# Patient Record
Sex: Female | Born: 1990 | Race: Black or African American | Hispanic: No | Marital: Single | Smoking: Never smoker
Health system: Southern US, Community
[De-identification: ages and names within clinical notes are randomized; demographics above are authoritative.]

---

## 2013-05-10 DIAGNOSIS — IMO0002 Reserved for concepts with insufficient information to code with codable children: Secondary | ICD-10-CM | POA: Insufficient documentation

## 2014-12-15 ENCOUNTER — Encounter (HOSPITAL_BASED_OUTPATIENT_CLINIC_OR_DEPARTMENT_OTHER): Payer: Self-pay | Admitting: Emergency Medicine

## 2014-12-15 ENCOUNTER — Emergency Department (HOSPITAL_BASED_OUTPATIENT_CLINIC_OR_DEPARTMENT_OTHER)

## 2014-12-15 ENCOUNTER — Emergency Department (HOSPITAL_BASED_OUTPATIENT_CLINIC_OR_DEPARTMENT_OTHER)
Admission: EM | Admit: 2014-12-15 | Discharge: 2014-12-15 | Disposition: A | Discharge status: Home / Self Care | Attending: Physician Assistant | Admitting: Physician Assistant

## 2014-12-15 DIAGNOSIS — S99912A Unspecified injury of left ankle, initial encounter: Secondary | ICD-10-CM | POA: Diagnosis not present

## 2014-12-15 DIAGNOSIS — Y990 Civilian activity done for income or pay: Secondary | ICD-10-CM | POA: Diagnosis not present

## 2014-12-15 DIAGNOSIS — W101XXA Fall (on)(from) sidewalk curb, initial encounter: Secondary | ICD-10-CM | POA: Insufficient documentation

## 2014-12-15 DIAGNOSIS — Y9389 Activity, other specified: Secondary | ICD-10-CM | POA: Insufficient documentation

## 2014-12-15 DIAGNOSIS — Y9289 Other specified places as the place of occurrence of the external cause: Secondary | ICD-10-CM | POA: Insufficient documentation

## 2014-12-15 MED ORDER — HYDROCODONE-ACETAMINOPHEN 5-325 MG PO TABS
2.0000 | ORAL_TABLET | Freq: Once | ORAL | Status: AC
  Administered 2014-12-15: 2 via ORAL
  Filled 2014-12-15: qty 2

## 2014-12-15 MED ORDER — NAPROXEN 500 MG PO TABS: 500.0000 mg | ORAL_TABLET | Freq: Two times a day (BID) | ORAL | Status: DC

## 2014-12-15 NOTE — ED Notes (Signed)
Patient states that she stepped down off a curb at work and hurt her left ankle

## 2014-12-15 NOTE — ED Provider Notes (Signed)
CSN: [id number]     Arrival date & time 12/15/14  2039 History   First MD Initiated Contact with Patient 12/15/14 2107     Chief Complaint  Patient presents with  . Ankle Pain    left     (Consider location/radiation/quality/duration/timing/severity/associated sxs/prior Treatment) Patient is a 24 y.o. female presenting with ankle pain. The history is provided by the patient and medical records.  Ankle Pain  24 year old female with no significant past medical history presenting to the ED for left ankle injury. Patient states she stepped off a curb it was covered and leaves and twisted her left ankle. She states she fell to the ground. No head injury or loss of consciousness.  Patient reports diffuse pain of left ankle. She's been unable to bear weight due to pain. She denies any numbness or weakness of her left leg or foot. No prior left ankle injuries or surgeries. No intervention tried prior to arrival.  History reviewed. No pertinent past medical history. History reviewed. No pertinent past surgical history. History reviewed. No pertinent family history. Social History  Substance Use Topics  . Smoking status: Never Smoker   . Smokeless tobacco: None  . Alcohol Use: No   OB History    No data available     Review of Systems  Musculoskeletal: Positive for arthralgias.  All other systems reviewed and are negative.     Allergies  Review of patient's allergies indicates no known allergies.  Home Medications   Prior to Admission medications   Not on File   BP 95/58 mmHg  Pulse 76  Temp(Src) 98.3 F (36.8 C) (Oral)  Resp 16  Ht  (1.549 m)  Wt 195 lb (88.451 kg)  BMI 36.86 kg/m2  SpO2 100%  LMP 12/12/2014   Physical Exam  Constitutional: She is oriented to person, place, and time. She appears well-developed and well-nourished. No distress.  HENT:  Head: Normocephalic and atraumatic.  Mouth/Throat: Oropharynx is clear and moist.  Eyes: Conjunctivae and EOM  are normal. Pupils are equal, round, and reactive to light.  Neck: Normal range of motion. Neck supple.  Cardiovascular: Normal rate, regular rhythm and normal heart sounds.   Pulmonary/Chest: Effort normal and breath sounds normal. No respiratory distress. She has no wheezes.  Abdominal: Soft. Bowel sounds are normal. There is no tenderness. There is no guarding.  Musculoskeletal: Normal range of motion. She exhibits no edema.       Left ankle: She exhibits swelling. She exhibits normal range of motion, no ecchymosis, no deformity, no laceration and normal pulse. Tenderness. Lateral malleolus tenderness found. Achilles tendon normal.  Left ankle with tenderness and swelling along lateral malleolus, no acute bony deformity noted; limited range of motion due to pain and poor patient effort; DP pulse intact; moving all toes appropriately; normal sensation throughout foot  Neurological: She is alert and oriented to person, place, and time.  Skin: Skin is warm and dry. She is not diaphoretic.  Psychiatric: She has a normal mood and affect.  Nursing note and vitals reviewed.   ED Course  Procedures (including critical care time) Labs Review Labs Reviewed - No data to display  Imaging Review No results found. I have personally reviewed and evaluated these images and lab results as part of my medical decision-making.   EKG Interpretation None      MDM   Final diagnoses:  Left ankle injury, initial encounter   24 year old female here with left ankle injury after twisting ankle on  a curb. No head injury or loss of consciousness. No acute deformity noted on exam. Swelling and tenderness of left lateral malleolus noted. X-ray was obtained which is negative for acute bony findings. Suspect sprain type injury. Patient was placed in ASO brace and given crutches. Will discharge home with supportive care, encouraged to ice and elevate ankle at home to help with pain and/or swelling. Patient is to  follow-up with her PCP. Orthopedic follow-up also given if needed.  Discussed plan with patient, he/she acknowledged understanding and agreed with plan of care.  Return precautions given for new or worsening symptoms.  Garlon HatchetLisa M Danesha Kirchoff, PA-C 12/15/14 2211  Courteney Randall AnLyn Mackuen, MD 12/16/14 [phone]

## 2014-12-15 NOTE — Discharge Instructions (Signed)
Take the prescribed medication as directed.  Recommend to ice and elevate ankle at home to help with pain and/or swelling. Follow-up with your primary care physician. You may also follow-up with orthopedics if needed-- referral provided. Return to the ED for new or worsening symptoms.

## 2014-12-18 ENCOUNTER — Encounter: Payer: Self-pay | Admitting: Family Medicine

## 2014-12-18 ENCOUNTER — Ambulatory Visit: Payer: Worker's Compensation | Admitting: Family Medicine

## 2014-12-18 ENCOUNTER — Ambulatory Visit (INDEPENDENT_AMBULATORY_CARE_PROVIDER_SITE_OTHER): Payer: Worker's Compensation | Admitting: Family Medicine

## 2014-12-18 VITALS — BP 100/63 | HR 72 | Ht 61.0 in | Wt 190.0 lb

## 2014-12-18 DIAGNOSIS — S93402A Sprain of unspecified ligament of left ankle, initial encounter: Secondary | ICD-10-CM | POA: Diagnosis not present

## 2014-12-18 MED ORDER — HYDROCODONE-ACETAMINOPHEN 5-325 MG PO TABS: 1.0000 | ORAL_TABLET | Freq: Four times a day (QID) | ORAL | Status: DC | PRN

## 2014-12-18 NOTE — Patient Instructions (Signed)
You have an ankle sprain. Ice the area for 15 minutes at a time, 3-4 times a day Aleve 2 tabs twice a day with food OR ibuprofen 3 tabs three times a day with food for pain and inflammation. Elevate above the level of your heart when possible Crutches if needed to help with walking Bear weight when tolerated Use boot or laceup ankle brace to help with stability while you recover from this injury. Come out of the boot/brace twice a day to do Up/down and alphabet exercises 2-3 sets of each. Consider physical therapy for strengthening and balance exercises in the future. If not improving as expected, we may repeat x-rays or consider further testing like an MRI. Follow up with me in 2 weeks.

## 2014-12-19 ENCOUNTER — Telehealth: Payer: Self-pay | Admitting: Family Medicine

## 2014-12-19 DIAGNOSIS — S99912A Unspecified injury of left ankle, initial encounter: Secondary | ICD-10-CM | POA: Insufficient documentation

## 2014-12-19 NOTE — Telephone Encounter (Signed)
I just received it.  I will fill it out and have it faxed over in the morning.

## 2014-12-19 NOTE — Progress Notes (Signed)
PCP: Pcp Not In System  Subjective:   HPI: Patient is a 24 y.o. female here for left ankle injury.  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 Unable to bear weight after this. Pain still at 8/10 level, sharp. Has been wearing a splint, taking naproxen and elevating, icing. She injured one of her ankles when she was younger but doesn't remember which one. No skin changes, fever, other complaints.  No past medical history on file.  Current Outpatient Prescriptions on File Prior to Visit  Medication Sig Dispense Refill  . naproxen (NAPROSYN) 500 MG tablet Take 1 tablet (500 mg total) by mouth 2 (two) times daily with a meal. 30 tablet 0   No current facility-administered medications on file prior to visit.    No past surgical history on file.  Allergies  Allergen Reactions  . Nickel Itching    Patient states that she breaks out and it has not happened in a while though.    Social History   Social History  . Marital Status: Single    Spouse Name: N/A  . Number of Children: N/A  . Years of Education: N/A   Occupational History  . Not on file.   Social History Main Topics  . Smoking status: Never Smoker   . Smokeless tobacco: Not on file  . Alcohol Use: No  . Drug Use: No  . Sexual Activity: Not on file   Other Topics Concern  . Not on file   Social History Narrative    No family history on file.  BP 100/63 mmHg  Pulse 72  Ht 5\' 1"  (1.549 m)  Wt 190 lb (86.183 kg)  BMI 35.92 kg/m2  LMP 12/12/2014  Review of Systems: See HPI above.    Objective:  Physical Exam:  Gen: NAD  Left ankle/foot: Mod diffuse swelling.  No bruising, other deformity. Limited motion all directions but able to do so. TTP greatest ATFL, lateral malleolus.  No other tenderness.  1+ ant drawer, talar tilt - both painful and guarding. Negative syndesmotic compression. Thompsons test  negative. NV intact distally.  Right ankle/foot: FROM without pain.    Assessment & Plan:  1. Left ankle sprain - independent review of radiographs negative.  Icing, nsaids, cam walker with transition to ASO.  Shown home motion exercises to do daily.  Bear weight as tolerated.  F/u in 2 weeks.  Norco as needed for severe pain.  Add strengthening or PT at follow-up.  Out of work in meantime.

## 2014-12-19 NOTE — Assessment & Plan Note (Signed)
independent review of radiographs negative.  Icing, nsaids, cam walker with transition to ASO.  Shown home motion exercises to do daily.  Bear weight as tolerated.  F/u in 2 weeks.  Norco as needed for severe pain.  Add strengthening or PT at follow-up.  Out of work in meantime.

## 2015-01-01 ENCOUNTER — Encounter: Payer: Self-pay | Admitting: Family Medicine

## 2015-01-01 ENCOUNTER — Ambulatory Visit (INDEPENDENT_AMBULATORY_CARE_PROVIDER_SITE_OTHER): Payer: Worker's Compensation | Admitting: Family Medicine

## 2015-01-01 VITALS — BP 117/79 | HR 69 | Ht 61.0 in | Wt 195.0 lb

## 2015-01-01 DIAGNOSIS — S93402D Sprain of unspecified ligament of left ankle, subsequent encounter: Secondary | ICD-10-CM | POA: Diagnosis not present

## 2015-01-01 NOTE — Patient Instructions (Signed)
Start the home theraband exercises as directed. We will put in a referral for physical therapy as well. Follow up with me in 4 weeks. Try to transition as tolerated from the boot into the laceup brace you have. Icing, elevation, aleve or ibuprofen as needed.

## 2015-01-07 NOTE — Assessment & Plan Note (Signed)
radiographs negative.  Icing, nsaids, cam walker with transition to ASO.  Continue home exercises.  Start physical therapy also.  F/u in 4 weeks.

## 2015-01-07 NOTE — Progress Notes (Signed)
PCP: Pcp Not In System  Subjective:   HPI: Patient is a 24 y.o. female here for left ankle injury.  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 Unable to bear weight after this. Pain still at 8/10 level, sharp. Has been wearing a splint, taking naproxen and elevating, icing. She injured one of her ankles when she was younger but doesn't remember which one. No skin changes, fever, other complaints.  11/22: Patient reports she has had some improvement since last visit. Pain level 6/10, still sharp. Using cam walker. Sore laterally mainly. Taking norco as needed. Worse with walking. No skin changes, fever, other complaints.  No past medical history on file.  Current Outpatient Prescriptions on File Prior to Visit  Medication Sig Dispense Refill  . HYDROcodone-acetaminophen (NORCO) 5-325 MG tablet Take 1 tablet by mouth every 6 (six) hours as needed for moderate pain. 40 tablet 0  . naproxen (NAPROSYN) 500 MG tablet Take 1 tablet (500 mg total) by mouth 2 (two) times daily with a meal. 30 tablet 0   No current facility-administered medications on file prior to visit.    No past surgical history on file.  Allergies  Allergen Reactions  . Nickel Itching    Patient states that she breaks out and it has not happened in a while though.    Social History   Social History  . Marital Status: Single    Spouse Name: N/A  . Number of Children: N/A  . Years of Education: N/A   Occupational History  . Not on file.   Social History Main Topics  . Smoking status: Never Smoker   . Smokeless tobacco: Not on file  . Alcohol Use: No  . Drug Use: No  . Sexual Activity: Not on file   Other Topics Concern  . Not on file   Social History Narrative    No family history on file.  BP 117/79 mmHg  Pulse 69  Ht 5\' 1"  (1.549 m)  Wt 195 lb (88.451 kg)  BMI 36.86 kg/m2  LMP  12/12/2014  Review of Systems: See HPI above.    Objective:  Physical Exam:  Gen: NAD, comfortable in exam room.  Left ankle/foot: Mod diffuse swelling.  No bruising, other deformity. Limited motion all directions but able to do so. TTP ATFL.  No other tenderness.  1+ ant drawer, talar tilt. Negative syndesmotic compression. Thompsons test negative. NV intact distally.  Right ankle/foot: FROM without pain.    Assessment & Plan:  1. Left ankle sprain - radiographs negative.  Icing, nsaids, cam walker with transition to ASO.  Continue home exercises.  Start physical therapy also.  F/u in 4 weeks.

## 2015-01-18 ENCOUNTER — Encounter: Payer: Self-pay | Admitting: Family Medicine

## 2015-01-29 ENCOUNTER — Encounter: Payer: Self-pay | Admitting: Family Medicine

## 2015-01-29 ENCOUNTER — Ambulatory Visit (INDEPENDENT_AMBULATORY_CARE_PROVIDER_SITE_OTHER): Payer: Worker's Compensation | Admitting: Family Medicine

## 2015-01-29 VITALS — BP 129/88 | HR 78 | Ht 61.0 in | Wt 195.0 lb

## 2015-01-29 DIAGNOSIS — S93402D Sprain of unspecified ligament of left ankle, subsequent encounter: Secondary | ICD-10-CM

## 2015-01-29 DIAGNOSIS — S99912D Unspecified injury of left ankle, subsequent encounter: Secondary | ICD-10-CM | POA: Diagnosis not present

## 2015-01-29 NOTE — Patient Instructions (Signed)
We will go ahead with MRI at this point. Continue home exercises. Try to transition as tolerated from the boot into the laceup brace you have. We will put in a referral for physical therapy as well. Icing, elevation, aleve or ibuprofen as needed. Continue current restrictions at work.

## 2015-01-30 NOTE — Progress Notes (Signed)
PCP: Pcp Not In System  Subjective:   HPI: Patient is a 24 y.o. female here for left ankle injury.  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 Unable to bear weight after this. Pain still at 8/10 level, sharp. Has been wearing a splint, taking naproxen and elevating, icing. She injured one of her ankles when she was younger but doesn't remember which one. No skin changes, fever, other complaints.  11/22: Patient reports she has had some improvement since last visit. Pain level 6/10, still sharp. Using cam walker. Sore laterally mainly. Taking norco as needed. Worse with walking. No skin changes, fever, other complaints.  12/20: Patient returns with 7/10 level ankle pain, sharp lateral ankle. Using cam walker still. Doing home exercises. Pain worse with ambulation. No skin changes, fever, other complaints. Despite request she has not heard from physical therapy.  No past medical history on file.  Current Outpatient Prescriptions on File Prior to Visit  Medication Sig Dispense Refill  . HYDROcodone-acetaminophen (NORCO) 5-325 MG tablet Take 1 tablet by mouth every 6 (six) hours as needed for moderate pain. 40 tablet 0  . naproxen (NAPROSYN) 500 MG tablet Take 1 tablet (500 mg total) by mouth 2 (two) times daily with a meal. 30 tablet 0   No current facility-administered medications on file prior to visit.    No past surgical history on file.  Allergies  Allergen Reactions  . Nickel Itching    Patient states that she breaks out and it has not happened in a while though.    Social History   Social History  . Marital Status: Single    Spouse Name: N/A  . Number of Children: N/A  . Years of Education: N/A   Occupational History  . Not on file.   Social History Main Topics  . Smoking status: Never Smoker   . Smokeless tobacco: Not on file  . Alcohol Use: No  . Drug  Use: No  . Sexual Activity: Not on file   Other Topics Concern  . Not on file   Social History Narrative    No family history on file.  BP 129/88 mmHg  Pulse 78  Ht 5\' 1"  (1.549 m)  Wt 195 lb (88.451 kg)  BMI 36.86 kg/m2  Review of Systems: See HPI above.    Objective:  Physical Exam:  Gen: NAD, comfortable in exam room.  Left ankle/foot: Mild diffuse swelling.  No bruising, other deformity. Limited motion all directions but able to do so. TTP ATFL, ant ankle joint.  No other tenderness.  1+ ant drawer, talar tilt. Negative syndesmotic compression. Thompsons test negative. NV intact distally.  Right ankle/foot: FROM without pain.    Assessment & Plan:  1. Left ankle injury - likely 2/2 sprain but she is not improving after 6 weeks as expected.  Will go ahead with MRI to assess for osteochondral defect, other abnormalities that could account for her pain outside of a sprain.  Continue current restrictions, icing, nsaids, elevation.  Try to switch to ASO if possible.

## 2015-01-30 NOTE — Assessment & Plan Note (Signed)
likely 2/2 sprain but she is not improving after 6 weeks as expected.  Will go ahead with MRI to assess for osteochondral defect, other abnormalities that could account for her pain outside of a sprain.  Continue current restrictions, icing, nsaids, elevation.  Try to switch to ASO if possible.

## 2015-04-15 ENCOUNTER — Ambulatory Visit (INDEPENDENT_AMBULATORY_CARE_PROVIDER_SITE_OTHER): Admitting: Family Medicine

## 2015-04-15 ENCOUNTER — Encounter: Payer: Self-pay | Admitting: Family Medicine

## 2015-04-15 VITALS — BP 106/73 | HR 76 | Ht 61.0 in | Wt 200.0 lb

## 2015-04-15 DIAGNOSIS — S93402D Sprain of unspecified ligament of left ankle, subsequent encounter: Secondary | ICD-10-CM | POA: Diagnosis not present

## 2015-04-15 DIAGNOSIS — S99912D Unspecified injury of left ankle, subsequent encounter: Secondary | ICD-10-CM

## 2015-04-15 NOTE — Patient Instructions (Signed)
Be diligent about the home exercises with theraband. Much of your pain is due to weakness following the ankle sprain and posterior tibialis tendon strain at this point. We will put in an order for physical therapy - hold the MRI for now. Try not to use the boot at all - brace if needed at this point. Consider arch supports (like dr. Jari Sportsmanscholls active series or something similar). Icing, elevation, aleve or ibuprofen as needed. Continue current restrictions at work. Follow up with me in 6 weeks.

## 2015-04-17 NOTE — Assessment & Plan Note (Signed)
Initial injury sprain - improving but still with laxity.  This has led to overuse of medial ankle tendons, specifically posterior tibialis tendon.  Put in order again for physical therapy - do not think she needs MRI now based on her exam.  Encouraged arch supports but discontinuing any bracing or boot at this time.  Icing, elevation, nsaids.  F/u in 6 weeks.  Work restriction form filled out and will be scanned into the chart.

## 2015-04-17 NOTE — Progress Notes (Signed)
PCP: Pcp Not In System  Subjective:   HPI: Patient is a 25 y.o. female here for left ankle injury.  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 Unable to bear weight after this. Pain still at 8/10 level, sharp. Has been wearing a splint, taking naproxen and elevating, icing. She injured one of her ankles when she was younger but doesn't remember which one. No skin changes, fever, other complaints.  11/22: Patient reports she has had some improvement since last visit. Pain level 6/10, still sharp. Using cam walker. Sore laterally mainly. Taking norco as needed. Worse with walking. No skin changes, fever, other complaints.  12/20: Patient returns with 7/10 level ankle pain, sharp lateral ankle. Using cam walker still. Doing home exercises. Pain worse with ambulation. No skin changes, fever, other complaints. Despite request she has not heard from physical therapy.  04/15/15: Patient reports mild improvement since last visit. Pain level down to 5/10, sharp. Pain more medial than anterior and lateral now. Still uses boot occasionally if very sore. Worse with a lot of standing, walking. No skin changes, fever. Has not heard regarding physical therapy or MRI ordered last visit.  No past medical history on file.  No current outpatient prescriptions on file prior to visit.   No current facility-administered medications on file prior to visit.    No past surgical history on file.  Allergies  Allergen Reactions  . Nickel Itching    Patient states that she breaks out and it has not happened in a while though.    Social History   Social History  . Marital Status: Single    Spouse Name: N/A  . Number of Children: N/A  . Years of Education: N/A   Occupational History  . Not on file.   Social History Main Topics  . Smoking status: Never Smoker   . Smokeless tobacco: Not  on file  . Alcohol Use: No  . Drug Use: No  . Sexual Activity: Not on file   Other Topics Concern  . Not on file   Social History Narrative    No family history on file.  BP 106/73 mmHg  Pulse 76  Ht 5\' 1"  (1.549 m)  Wt 200 lb (90.719 kg)  BMI 37.81 kg/m2  Review of Systems: See HPI above.    Objective:  Physical Exam:  Gen: NAD, comfortable in exam room.  Left ankle/foot: Mild diffuse swelling.  No bruising, other deformity. FROM - pain on internal rotation. TTP posterior tibialis tendon course.  No anterior ankle joint, ATFL tenderness now.  1+ ant drawer, talar tilt. Negative syndesmotic compression. Thompsons test negative. NV intact distally.  Right ankle/foot: FROM without pain.    Assessment & Plan:  1. Left ankle injury - Initial injury sprain - improving but still with laxity.  This has led to overuse of medial ankle tendons, specifically posterior tibialis tendon.  Put in order again for physical therapy - do not think she needs MRI now based on her exam.  Encouraged arch supports but discontinuing any bracing or boot at this time.  Icing, elevation, nsaids.  F/u in 6 weeks.  Work restriction form filled out and will be scanned into the chart.

## 2015-05-04 ENCOUNTER — Emergency Department (HOSPITAL_BASED_OUTPATIENT_CLINIC_OR_DEPARTMENT_OTHER)
Admission: EM | Admit: 2015-05-04 | Discharge: 2015-05-04 | Disposition: A | Discharge status: Home / Self Care | Attending: Emergency Medicine | Admitting: Emergency Medicine

## 2015-05-04 ENCOUNTER — Emergency Department (HOSPITAL_BASED_OUTPATIENT_CLINIC_OR_DEPARTMENT_OTHER)

## 2015-05-04 ENCOUNTER — Encounter (HOSPITAL_BASED_OUTPATIENT_CLINIC_OR_DEPARTMENT_OTHER): Payer: Self-pay | Admitting: Emergency Medicine

## 2015-05-04 DIAGNOSIS — S99922A Unspecified injury of left foot, initial encounter: Secondary | ICD-10-CM | POA: Diagnosis present

## 2015-05-04 DIAGNOSIS — Y9389 Activity, other specified: Secondary | ICD-10-CM | POA: Insufficient documentation

## 2015-05-04 DIAGNOSIS — S92352A Displaced fracture of fifth metatarsal bone, left foot, initial encounter for closed fracture: Secondary | ICD-10-CM | POA: Insufficient documentation

## 2015-05-04 DIAGNOSIS — Y9289 Other specified places as the place of occurrence of the external cause: Secondary | ICD-10-CM | POA: Insufficient documentation

## 2015-05-04 DIAGNOSIS — Y990 Civilian activity done for income or pay: Secondary | ICD-10-CM | POA: Diagnosis not present

## 2015-05-04 DIAGNOSIS — X501XXA Overexertion from prolonged static or awkward postures, initial encounter: Secondary | ICD-10-CM | POA: Insufficient documentation

## 2015-05-04 MED ORDER — HYDROCODONE-ACETAMINOPHEN 5-325 MG PO TABS: 1.0000 | ORAL_TABLET | Freq: Four times a day (QID) | ORAL | Status: DC | PRN

## 2015-05-04 NOTE — Discharge Instructions (Signed)
Metatarsal Fracture With Rehab  A metatarsal fracture is a broken bone in one of the five bones that connect your toes to the rest of your foot (forefoot fracture). Metatarsals are long bones that can be stressed or cracked easily. A metatarsal fracture can be:  · A stress fracture. Stress fractures are cracks in the surface of the metatarsal bone. Athletes often get stress fractures.  · A complete fracture. A complete fracture goes all the way through the bone.  The bone that connects to the pinky toe (fifth metatarsal) is the most commonly fractured metatarsal. Ballet dancers often fracture this bone.  CAUSES   Stress fractures may be caused by:  · Poor training technique.  · Sudden increase in activity.  · Changing your activity to a harder surface.  · Wearing athletic shoes that do not have enough cushioning.  Complete fractures are usually caused by:  · Dropping a heavy object on your foot.  · An injury that severely twists your foot.  SIGNS AND SYMPTOMS  The most common symptom of a stress fracture is foot pain that goes away with rest. The most common symptom of a complete fracture is intense pain that persists after the injury. Other symptoms of both fracture types include:  · Bruising.  · Swelling.  · Pain with movement or putting weight on the foot.  · Tenderness or pain with pressure.  · Trouble walking.  DIAGNOSIS   Your health care provider may suspect a metatarsal fracture based on your symptoms and medical history. Your health care provider will also do a physical exam. During the physical exam, your health care provider may try to move your foot and toes to check for pain and limited movement. Your foot will also be checked for:  · Bruising.  · Tenderness.  · Swelling.  · Deformity.  Other tests that may be done include:  · X-rays. X-rays are able to show most fractures.  · A bone scan. This test may be necessary to show a stress fracture.  TREATMENT   Treatment for a metatarsal fracture depends on  how severe the fracture was and the type of fracture. Treatment may include:  · Surgery. Surgery is usually needed to repair a displaced fracture. A displaced fracture happens when pieces of the broken bone are moved out of place (displacement). After surgery, you may need to wear a short walking cast for 6 to 8 weeks.  · Medicines. Medicines may be used to reduce swelling and pain.  · Physical therapy. Physical therapy may last for several months.  · Use of a supportive device, such as:    Elastic wrap.    Splint.    Boot.    Cast.    Crutches to support weight on your foot until the broken bone heals.  You can usually treat a stress fracture or a nondisplaced fracture with:   · Rest.  · Ice.  · Elevation.  · Support.  HOME CARE INSTRUCTIONS  · Follow all your health care provider's instructions.  · Take medicine only as directed by your health care provider.  · Rest your foot until your health care provider says you can resume your usual activities.  · When resting, keep your foot raised above the level of your heart (elevated).  · Ice may help reduce pain and swelling.  ¨ Place ice in a plastic bag.  ¨ Place a towel between your skin and the bag.  ¨ Leave the ice on for 20   minutes, 2-3 times a day.  · Wear your supportive device as directed.  · Do not get your cast or splint wet.  · Keep all follow-up visits as directed by your health care provider. If your health care provider recommended physical therapy, it is very important for proper healing of your injury that you keep all visits.  PREVENTION   After your fracture has healed, you can prevent another fracture by:  · Starting new sports activities gradually.  · Cross training to avoid putting stress on the same part of your foot every day (such as alternate running with swimming or biking).  · Eat a healthy diet that includes plenty of calcium and vitamin D.  · Wear the right athletic shoes for your sport. Replace them when they wear out.  · Stop your  activity or training if you have pain or swelling in your foot. Rest for a few days.  SEEK MEDICAL CARE IF:  · You have pain that is getting worse.  · You develop chills or fever.  · Your foot feels numb.  · Your cast or splint is damaged.  · You notice swelling or redness below your cast or splint.  WHAT REHABILITATION EXERCISES CAN I DO AT HOME?  Ask your health care provider or physical therapist when you can start doing exercises at home. Doing these exercises 3-5 times a week can help you regain strength and flexibility. If doing any of these exercises causes pain, stop and contact your health care provider or physical therapist.  Heel Cord Stretch  Do 2 sets of 10 repetitions.  · Stand facing a wall with your healthy foot forward and your knee slightly bent.  · Place both hands on the wall for support.  · Stretch your healing foot out straight behind you.  · Keep both heels on the floor.  · Hold the stretch for 30 seconds.  · You can also do this exercise with both knees slightly bent. Repeat the same steps.  Golf Ball Roll  Do this once every day.  · Sit on a chair and roll a golf ball under your healing foot for two minutes.  Towel Stretch  · Sit on the floor with your legs out straight.  · Loop a towel around the front of your healing foot.  · Use both hands to pull the ends of the towel, stretching your foot back toward your body.  · Hold the stretch for 30 seconds and repeat 3 times.  Calf Raises  Do 2 sets of 10 repetitions.  · Stand behind a chair and use your hands to support you.  · Lift your good foot off the ground.  · Rise up on the front of your healing foot as high as you can.  · Repeat the lift 10 times.  Marble Pickup  Do this once a day.  · Sit in a chair and put 20 marbles on the floor in front of you.  · Use the toes of your healing foot to pick up each marble.  Towel Curls  Repeat this exercise 5 times.  · Sit in a chair and place a small towel on the floor in front of you.  · Use the toes  of your healing foot to grab the towel and pull it toward you.     This information is not intended to replace advice given to you by your health care provider. Make sure you discuss any questions you have with your health   care provider.     Document Released: 01/26/2005 Document Revised: 06/12/2014 Document Reviewed: 05/11/2013  Elsevier Interactive Patient Education ©2016 [employer]

## 2015-05-04 NOTE — ED Notes (Signed)
Pt c/o left foot pain since twisting ankle yesterday at work.

## 2015-05-04 NOTE — ED Provider Notes (Signed)
CSN: [id number]     Arrival date & time 05/04/15  1105 History   First MD Initiated Contact with Patient 05/04/15 1136     Chief Complaint  Patient presents with  . Foot Pain     (Consider location/radiation/quality/duration/timing/severity/associated sxs/prior Treatment) Patient is a 25 y.o. female presenting with foot injury. The history is provided by the patient.  Foot Injury Location:  Foot Time since incident:  1 day Injury: yes   Mechanism of injury comment:  Twisted ankle at work Foot location:  L foot Pain details:    Quality:  Aching   Radiates to:  Does not radiate   Severity:  Moderate   Onset quality:  Gradual   Duration:  1 day   Timing:  Constant   Progression:  Unchanged Chronicity:  New Dislocation: no   Foreign body present:  No foreign bodies Prior injury to area:  Yes (to ankle remotely) Relieved by:  Nothing Worsened by:  Nothing tried Ineffective treatments:  None tried Associated symptoms: stiffness and swelling   Associated symptoms: no muscle weakness and no numbness     History reviewed. No pertinent past medical history. Past Surgical History  Procedure Laterality Date  . Cesarean section     No family history on file. Social History  Substance Use Topics  . Smoking status: Never Smoker   . Smokeless tobacco: None  . Alcohol Use: No   OB History    No data available     Review of Systems  Musculoskeletal: Positive for stiffness.  All other systems reviewed and are negative.     Allergies  Nickel  Home Medications   Prior to Admission medications   Medication Sig Start Date End Date Taking? Authorizing Provider  HYDROcodone-acetaminophen (NORCO/VICODIN) 5-325 MG tablet Take 1 tablet by mouth every 6 (six) hours as needed for severe pain. 05/04/15   Lyndal Pulleyaniel Charlye Spare, MD   BP 115/67 mmHg  Pulse 70  Temp(Src) 98.2 F (36.8 C)  Resp 18  Ht 5\' 1"  (1.549 m)  Wt 190 lb (86.183 kg)  BMI 35.92 kg/m2  SpO2 100%  LMP 04/07/2015  (Approximate) Physical Exam  Constitutional: She is oriented to person, place, and time. She appears well-developed and well-nourished. No distress.  HENT:  Head: Normocephalic.  Eyes: Conjunctivae are normal.  Neck: Neck supple. No tracheal deviation present.  Cardiovascular: Normal rate and regular rhythm.   Pulmonary/Chest: Effort normal. No respiratory distress.  Abdominal: Soft. She exhibits no distension.  Musculoskeletal:       Left foot: There is tenderness.       Feet:  Neurological: She is alert and oriented to person, place, and time.  Skin: Skin is warm and dry.  Psychiatric: She has a normal mood and affect.    ED Course  Procedures (including critical care time) Labs Review Labs Reviewed - No data to display  Imaging Review Dg Foot Complete Left  05/04/2015  CLINICAL DATA:  25 year old female with left lateral foot pain and swelling after rolling her foot at work yesterday. EXAM: LEFT FOOT - COMPLETE 3+ VIEW COMPARISON:  Prior radiographs of the left ankle 12/15/2014 FINDINGS: There is a lucency through the base of the fifth metatarsal consistent with an acute fracture. The fracture does not extend to the joint line and is therefore consistent with a Jones type fracture. There is associated soft tissue swelling. The remainder the visualized bones and joints are unremarkable. IMPRESSION: Positive for Jones fracture of the base of the fifth metatarsal.  Electronically Signed   By: Malachy Moan M.D.   On: 05/04/2015 12:13   I have personally reviewed and evaluated these images and lab results as part of my medical decision-making.   EKG Interpretation None      MDM   Final diagnoses:  Jones fracture, left, closed, initial encounter    25 y.o. female presents with Left lateral foot pain after twisting her ankle yesterday. Radiology is consistent with Jones or pseudo-Jones fracture with linear regularity in zone 1 crossing into zone 2. Nonoperative management  with weightbearing as tolerated on crutches until able to follow-up with sports medicine for re-evaluation.   Lyndal Pulley, MD 05/05/15 [phone]

## 2015-05-06 ENCOUNTER — Encounter: Payer: Self-pay | Admitting: Family Medicine

## 2015-05-06 ENCOUNTER — Ambulatory Visit (INDEPENDENT_AMBULATORY_CARE_PROVIDER_SITE_OTHER): Admitting: Family Medicine

## 2015-05-06 VITALS — BP 115/77 | HR 74 | Ht 61.0 in | Wt 195.0 lb

## 2015-05-06 DIAGNOSIS — S92352A Displaced fracture of fifth metatarsal bone, left foot, initial encounter for closed fracture: Secondary | ICD-10-CM

## 2015-05-06 NOTE — Patient Instructions (Addendum)
You have a Jones fracture. It's very important that you not put weight on this leg until we see healing on the x-rays (usually 4-6 weeks). Wear boot when up and around. Icing, tylenol or ibuprofen if needed for pain. If you need more of the pain medicine let me know. Have them fax us a form at [phone] Follow up with me in 2 weeks.

## 2015-05-07 DIAGNOSIS — S92353A Displaced fracture of fifth metatarsal bone, unspecified foot, initial encounter for closed fracture: Secondary | ICD-10-CM | POA: Insufficient documentation

## 2015-05-07 DIAGNOSIS — S99199A Other physeal fracture of unspecified metatarsal, initial encounter for closed fracture: Secondary | ICD-10-CM | POA: Insufficient documentation

## 2015-05-07 NOTE — Assessment & Plan Note (Signed)
Left 5th metatarsal Jones fracture - independently reviewed radiographs and confirmed presence of Jones fracture.  Stressed importance of wearing cam walker and not bearing any weight with this type of fracture.  Icing, tylenol or ibuprofen.  F/u in 2 weeks to reevaluate, repeat x-rays.

## 2015-05-07 NOTE — Progress Notes (Signed)
PCP: Pcp Not In System  Subjective:   HPI: Patient is a 25 y.o. female here for left foot injury.  Patient seen previously for ankle injury sustained at work. On 3/24 she was at work when she twisted her foot/ankle  Was able to continue working but pain worsened throughout the day and significant next day. Pain level 7/10 now, achy. Difficulty bearing weight - using postop shoe with crutches. Mild swelling, no bruising.  No past medical history on file.  Current Outpatient Prescriptions on File Prior to Visit  Medication Sig Dispense Refill  . HYDROcodone-acetaminophen (NORCO/VICODIN) 5-325 MG tablet Take 1 tablet by mouth every 6 (six) hours as needed for severe pain. 6 tablet 0   No current facility-administered medications on file prior to visit.    Past Surgical History  Procedure Laterality Date  . Cesarean section      Allergies  Allergen Reactions  . Nickel Itching    Patient states that she breaks out and it has not happened in a while though.    Social History   Social History  . Marital Status: Single    Spouse Name: N/A  . Number of Children: N/A  . Years of Education: N/A   Occupational History  . Not on file.   Social History Main Topics  . Smoking status: Never Smoker   . Smokeless tobacco: Not on file  . Alcohol Use: No  . Drug Use: No  . Sexual Activity: Not on file   Other Topics Concern  . Not on file   Social History Narrative    No family history on file.  BP 115/77 mmHg  Pulse 74  Ht 5\' 1"  (1.549 m)  Wt 195 lb (88.451 kg)  BMI 36.86 kg/m2  LMP 04/07/2015 (Approximate)  Review of Systems: See HPI above.    Objective:  Physical Exam:  Gen: NAD, comfortable in exam room  Left foot/ankle: Mild swelling lateral foot.  No gross deformity, ecchymoses Did not test ROM with known fracture. TTP 5th metatarsal. 1+ ant drawer and talar tilt.   Negative syndesmotic compression. Thompsons test negative. NV intact  distally.  Right foot/ankle: FROM without pain.    Assessment & Plan:  1. Left 5th metatarsal Jones fracture - independently reviewed radiographs and confirmed presence of Jones fracture.  Stressed importance of wearing cam walker and not bearing any weight with this type of fracture.  Icing, tylenol or ibuprofen.  F/u in 2 weeks to reevaluate, repeat x-rays.

## 2015-05-08 ENCOUNTER — Telehealth: Payer: Self-pay | Admitting: Family Medicine

## 2015-05-08 MED ORDER — HYDROCODONE-ACETAMINOPHEN 5-325 MG PO TABS: 1.0000 | ORAL_TABLET | Freq: Four times a day (QID) | ORAL | Status: AC | PRN

## 2015-05-08 NOTE — Telephone Encounter (Signed)
Script printed for hydrocodone - will be placed up front.

## 2015-05-13 NOTE — Telephone Encounter (Signed)
Script up front.

## 2015-05-16 ENCOUNTER — Telehealth: Payer: Self-pay | Admitting: Family Medicine

## 2015-05-16 NOTE — Telephone Encounter (Signed)
Spoke to patient and told her that the script is up front. Patient said she will pick up on 05-17-15.

## 2015-05-21 ENCOUNTER — Ambulatory Visit: Admitting: Family Medicine

## 2015-05-22 ENCOUNTER — Ambulatory Visit (HOSPITAL_BASED_OUTPATIENT_CLINIC_OR_DEPARTMENT_OTHER)
Admission: RE | Admit: 2015-05-22 | Discharge: 2015-05-22 | Disposition: A | Discharge status: Home / Self Care | Payer: Self-pay | Source: Ambulatory Visit | Attending: Family Medicine | Admitting: Family Medicine

## 2015-05-22 ENCOUNTER — Encounter: Payer: Self-pay | Admitting: Family Medicine

## 2015-05-22 ENCOUNTER — Ambulatory Visit (INDEPENDENT_AMBULATORY_CARE_PROVIDER_SITE_OTHER): Admitting: Family Medicine

## 2015-05-22 VITALS — BP 106/75 | HR 81 | Ht 61.0 in | Wt 200.0 lb

## 2015-05-22 DIAGNOSIS — S92352D Displaced fracture of fifth metatarsal bone, left foot, subsequent encounter for fracture with routine healing: Secondary | ICD-10-CM

## 2015-05-22 DIAGNOSIS — X58XXXD Exposure to other specified factors, subsequent encounter: Secondary | ICD-10-CM | POA: Insufficient documentation

## 2015-05-22 NOTE — Patient Instructions (Signed)
You have a Jones fracture. It's very important that you NOT put weight on this leg until we see healing on the x-rays (usually 4-6 weeks). Wear boot when up and around but again don't put weight on this. Icing, tylenol or ibuprofen if needed for pain. Norco as needed for severe pain (don't take tylenol with this). Follow up with me in 2 weeks.

## 2015-05-23 NOTE — Assessment & Plan Note (Signed)
independently reviewed repeat radiographs showing 5th metatarsal fracture.  Fracture line is more visible now as is expected and appears to be more of an avulsion type fracture than Jones fracture as the medial aspect of the fracture turns more proximal instead of straight across to the articulation with the 4th metatarsal - this would be very good prognostically.  I still Stressed importance of wearing cam walker and not bearing any weight (she ambulated in here today).  Icing, norco, ibuprofen.  F/u in 2 weeks to reevaluate, repeat x-rays.

## 2015-05-23 NOTE — Progress Notes (Signed)
PCP: Pcp Not In System  Subjective:   HPI: Patient is a 25 y.o. female here for left foot injury.  3/27: Patient seen previously for ankle injury sustained at work. On 3/24 she was at work when she twisted her foot/ankle  Was able to continue working but pain worsened throughout the day and significant next day. Pain level 7/10 now, achy. Difficulty bearing weight - using postop shoe with crutches. Mild swelling, no bruising.  4/12: Patient returns with minimal improvement since last visit. She came into the office ambulating in a postop shoe - is bearing weight and not using cam walker as instructed Pain level 7/10, sharp and achy lateral foot. Mild swelling but no bruising now. Did not pick up norco yet - received today. No skin changes, numbness otherwise.  No past medical history on file.  Current Outpatient Prescriptions on File Prior to Visit  Medication Sig Dispense Refill  . HYDROcodone-acetaminophen (NORCO/VICODIN) 5-325 MG tablet Take 1 tablet by mouth every 6 (six) hours as needed for severe pain. 40 tablet 0   No current facility-administered medications on file prior to visit.    Past Surgical History  Procedure Laterality Date  . Cesarean section      Allergies  Allergen Reactions  . Nickel Itching    Patient states that she breaks out and it has not happened in a while though.    Social History   Social History  . Marital Status: Single    Spouse Name: N/A  . Number of Children: N/A  . Years of Education: N/A   Occupational History  . Not on file.   Social History Main Topics  . Smoking status: Never Smoker   . Smokeless tobacco: Not on file  . Alcohol Use: No  . Drug Use: No  . Sexual Activity: Not on file   Other Topics Concern  . Not on file   Social History Narrative    No family history on file.  BP 106/75 mmHg  Pulse 81  Ht 5\' 1"  (1.549 m)  Wt 200 lb (90.719 kg)  BMI 37.81 kg/m2  LMP 04/07/2015 (Approximate)  Review of  Systems: See HPI above.    Objective:  Physical Exam:  Gen: NAD, comfortable in exam room  Left foot/ankle: Mild swelling lateral foot.  No gross deformity, ecchymoses Did not test ROM with known fracture. TTP 5th metatarsal proximally. 1+ ant drawer and talar tilt.   Negative syndesmotic compression. Thompsons test negative. NV intact distally.  Right foot/ankle: FROM without pain.    Assessment & Plan:  1. Left 5th metatarsal fracture - independently reviewed repeat radiographs showing 5th metatarsal fracture.  Fracture line is more visible now as is expected and appears to be more of an avulsion type fracture than Jones fracture as the medial aspect of the fracture turns more proximal instead of straight across to the articulation with the 4th metatarsal - this would be very good prognostically.  I still Stressed importance of wearing cam walker and not bearing any weight (she ambulated in here today).  Icing, norco, ibuprofen.  F/u in 2 weeks to reevaluate, repeat x-rays.

## 2015-05-30 ENCOUNTER — Telehealth: Payer: Self-pay | Admitting: Family Medicine

## 2015-05-30 NOTE — Telephone Encounter (Signed)
Ok thanks 

## 2015-05-30 NOTE — Telephone Encounter (Signed)
You know what I don't think they ever faxed it to me like they were going to that day.  I know she didn't bring one, I don't have one in my paperwork and I don't remember filling one out.

## 2015-06-03 ENCOUNTER — Ambulatory Visit: Payer: Self-pay | Admitting: Family Medicine

## 2015-06-06 ENCOUNTER — Ambulatory Visit: Admitting: Family Medicine

## 2015-06-07 ENCOUNTER — Telehealth: Payer: Self-pay | Admitting: Family Medicine

## 2015-06-07 ENCOUNTER — Ambulatory Visit: Admitting: Family Medicine

## 2015-06-07 NOTE — Telephone Encounter (Signed)
She asked that you or Dr. Rexene EdisonH call her back regarding her workers comp case.  I asked for detail but she said she did not want to talk to me about it and wouldn't give me any.  Please call, thanks.  PS.  Johnny BridgeMartha wanted to know what you thought about dismissing her if she doesn't comply with late policy on next appointment. She has had many no shows, technically.

## 2015-06-10 ENCOUNTER — Ambulatory Visit (INDEPENDENT_AMBULATORY_CARE_PROVIDER_SITE_OTHER): Admitting: Family Medicine

## 2015-06-10 ENCOUNTER — Ambulatory Visit (HOSPITAL_BASED_OUTPATIENT_CLINIC_OR_DEPARTMENT_OTHER)
Admission: RE | Admit: 2015-06-10 | Discharge: 2015-06-10 | Disposition: A | Discharge status: Home / Self Care | Source: Ambulatory Visit | Attending: Family Medicine | Admitting: Family Medicine

## 2015-06-10 ENCOUNTER — Encounter: Payer: Self-pay | Admitting: Family Medicine

## 2015-06-10 VITALS — BP 117/74 | HR 80 | Ht 61.0 in | Wt 200.0 lb

## 2015-06-10 DIAGNOSIS — S92352D Displaced fracture of fifth metatarsal bone, left foot, subsequent encounter for fracture with routine healing: Secondary | ICD-10-CM | POA: Diagnosis not present

## 2015-06-10 DIAGNOSIS — X58XXXD Exposure to other specified factors, subsequent encounter: Secondary | ICD-10-CM | POA: Insufficient documentation

## 2015-06-10 DIAGNOSIS — S92302D Fracture of unspecified metatarsal bone(s), left foot, subsequent encounter for fracture with routine healing: Secondary | ICD-10-CM | POA: Diagnosis not present

## 2015-06-10 NOTE — Patient Instructions (Signed)
Continue not putting weight on this, using the postop shoe. Follow up with me in 2 weeks for reevaluation, repeat x-rays.

## 2015-06-10 NOTE — Assessment & Plan Note (Signed)
independently reviewed repeat radiographs showing 5th metatarsal fracture.  No visible change from last radiographs and patient clinically still with 7/10 level of pain.  As noted, fracture appears to be more avulsion type than Jones fracture as was initially thought though treating conservatively.  Cam walker without weight bearing.  Icing, norco, ibuprofen.  F/u in 2 weeks to reevaluate, repeat x-rays.  If not clinically and radiographically improving would consider orthopedic referral to discuss possible ORIF.

## 2015-06-10 NOTE — Progress Notes (Signed)
PCP: Pcp Not In System  Subjective:   HPI: Patient is a 25 y.o. female here for left foot injury.  3/27: Patient seen previously for ankle injury sustained at work. On 3/24 she was at work when she twisted her foot/ankle  Was able to continue working but pain worsened throughout the day and significant next day. Pain level 7/10 now, achy. Difficulty bearing weight - using postop shoe with crutches. Mild swelling, no bruising.  4/12: Patient returns with minimal improvement since last visit. She came into the office ambulating in a postop shoe - is bearing weight and not using cam walker as instructed Pain level 7/10, sharp and achy lateral foot. Mild swelling but no bruising now. Did not pick up norco yet - received today. No skin changes, numbness otherwise.  5/1: Patient still with 7/10 sharp lateral pain in left foot. Has been wearing boot, using crutches and not bearing weight. Improved with rest, worse when up a lot. Ibuprofen with norco as needed. No skin changes, numbness.  No past medical history on file.  Current Outpatient Prescriptions on File Prior to Visit  Medication Sig Dispense Refill  . HYDROcodone-acetaminophen (NORCO/VICODIN) 5-325 MG tablet Take 1 tablet by mouth every 6 (six) hours as needed for severe pain. 40 tablet 0  . methylPREDNISolone (MEDROL DOSEPAK) 4 MG TBPK tablet Take by mouth.     No current facility-administered medications on file prior to visit.    Past Surgical History  Procedure Laterality Date  . Cesarean section      Allergies  Allergen Reactions  . Nickel Itching    Patient states that she breaks out and it has not happened in a while though.    Social History   Social History  . Marital Status: Single    Spouse Name: N/A  . Number of Children: N/A  . Years of Education: N/A   Occupational History  . Not on file.   Social History Main Topics  . Smoking status: Never Smoker   . Smokeless tobacco: Not on file  .  Alcohol Use: No  . Drug Use: No  . Sexual Activity: Not on file   Other Topics Concern  . Not on file   Social History Narrative    No family history on file.  BP 117/74 mmHg  Pulse 80  Ht 5\' 1"  (1.549 m)  Wt 200 lb (90.719 kg)  BMI 37.81 kg/m2  LMP 06/05/2015  Review of Systems: See HPI above.    Objective:  Physical Exam:  Gen: NAD, comfortable in exam room  Left foot/ankle: Minimal swelling lateral foot.  No gross deformity, ecchymoses Did not test ROM with known fracture. TTP 5th metatarsal proximally. 1+ ant drawer and talar tilt.   Negative syndesmotic compression. Thompsons test negative. NV intact distally.  Right foot/ankle: FROM without pain.    Assessment & Plan:  1. Left 5th metatarsal fracture - independently reviewed repeat radiographs showing 5th metatarsal fracture.  No visible change from last radiographs and patient clinically still with 7/10 level of pain.  As noted, fracture appears to be more avulsion type than Jones fracture as was initially thought though treating conservatively.  Cam walker without weight bearing.  Icing, norco, ibuprofen.  F/u in 2 weeks to reevaluate, repeat x-rays.  If not clinically and radiographically improving would consider orthopedic referral to discuss possible ORIF.

## 2015-06-10 NOTE — Telephone Encounter (Signed)
Spoke with patient - we will see her 5/1.  Reiterated no show policy and need to be here on time.

## 2015-06-24 ENCOUNTER — Encounter: Payer: Self-pay | Admitting: Family Medicine

## 2015-06-24 ENCOUNTER — Ambulatory Visit (HOSPITAL_BASED_OUTPATIENT_CLINIC_OR_DEPARTMENT_OTHER)
Admission: RE | Admit: 2015-06-24 | Discharge: 2015-06-24 | Disposition: A | Discharge status: Home / Self Care | Source: Ambulatory Visit | Attending: Family Medicine | Admitting: Family Medicine

## 2015-06-24 ENCOUNTER — Ambulatory Visit (INDEPENDENT_AMBULATORY_CARE_PROVIDER_SITE_OTHER): Admitting: Family Medicine

## 2015-06-24 VITALS — BP 124/75 | HR 65 | Ht 61.0 in | Wt 200.0 lb

## 2015-06-24 DIAGNOSIS — S92352G Displaced fracture of fifth metatarsal bone, left foot, subsequent encounter for fracture with delayed healing: Secondary | ICD-10-CM

## 2015-06-24 DIAGNOSIS — X58XXXD Exposure to other specified factors, subsequent encounter: Secondary | ICD-10-CM | POA: Insufficient documentation

## 2015-06-24 DIAGNOSIS — S92352D Displaced fracture of fifth metatarsal bone, left foot, subsequent encounter for fracture with routine healing: Secondary | ICD-10-CM | POA: Diagnosis not present

## 2015-06-25 NOTE — Assessment & Plan Note (Signed)
independently reviewed repeat radiographs showing 5th metatarsal fracture and performed bedside [facility] ultrasound.  Again no visible change from last radiographs and patient clinically still with 7/10 level of pain.  She is now 7 1/2 weeks out now - if this were a fibrous nonunion would expect her to feel better clinically and this has not occurred.  Will refer to ortho at this point to discuss possible ORIF.  Continue postop shoe and non weight bearing.

## 2015-06-25 NOTE — Progress Notes (Signed)
PCP: Pcp Not In System  Subjective:   HPI: Patient is a 25 y.o. female here for left foot injury.  3/27: Patient seen previously for ankle injury sustained at work. On 3/24 she was at work when she twisted her foot/ankle  Was able to continue working but pain worsened throughout the day and significant next day. Pain level 7/10 now, achy. Difficulty bearing weight - using postop shoe with crutches. Mild swelling, no bruising.  4/12: Patient returns with minimal improvement since last visit. She came into the office ambulating in a postop shoe - is bearing weight and not using cam walker as instructed Pain level 7/10, sharp and achy lateral foot. Mild swelling but no bruising now. Did not pick up norco yet - received today. No skin changes, numbness otherwise.  5/1: Patient still with 7/10 sharp lateral pain in left foot. Has been wearing boot, using crutches and not bearing weight. Improved with rest, worse when up a lot. Ibuprofen with norco as needed. No skin changes, numbness.  5/15: Patient reports still having 7/10 level pain lateral left foot, sharp. Has been wearing postop shoe but not bearing weight. Pain worse when up, improved when lying down. Ibuprofen with norco as needed. No skin changes, numbness.  No past medical history on file.  Current Outpatient Prescriptions on File Prior to Visit  Medication Sig Dispense Refill  . HYDROcodone-acetaminophen (NORCO/VICODIN) 5-325 MG tablet Take 1 tablet by mouth every 6 (six) hours as needed for severe pain. 40 tablet 0  . methylPREDNISolone (MEDROL DOSEPAK) 4 MG TBPK tablet Take by mouth.     No current facility-administered medications on file prior to visit.    Past Surgical History  Procedure Laterality Date  . Cesarean section      Allergies  Allergen Reactions  . Nickel Itching    Patient states that she breaks out and it has not happened in a while though.    Social History   Social History  .  Marital Status: Single    Spouse Name: N/A  . Number of Children: N/A  . Years of Education: N/A   Occupational History  . Not on file.   Social History Main Topics  . Smoking status: Never Smoker   . Smokeless tobacco: Not on file  . Alcohol Use: No  . Drug Use: No  . Sexual Activity: Not on file   Other Topics Concern  . Not on file   Social History Narrative    No family history on file.  BP 124/75 mmHg  Pulse 65  Ht  (1.549 m)  Wt 200 lb (90.719 kg)  BMI 37.81 kg/m2  LMP 06/05/2015  Review of Systems: See HPI above.    Objective:  Physical Exam:  Gen: NAD, comfortable in exam room  Left foot/ankle: Minimal swelling lateral foot.  No gross deformity, ecchymoses Did not test ROM with known fracture. TTP 5th metatarsal proximally. 1+ ant drawer and talar tilt.   Thompsons test negative. NV intact distally.  Right foot/ankle: FROM without pain.    Assessment & Plan:  1. Left 5th metatarsal fracture - independently reviewed repeat radiographs showing 5th metatarsal fracture and performed bedside [facility] ultrasound.  Again no visible change from last radiographs and patient clinically still with 7/10 level of pain.  She is now 7 1/2 weeks out now - if this were a fibrous nonunion would expect her to feel better clinically and this has not occurred.  Will refer to ortho at this point  to discuss possible ORIF.  Continue postop shoe and non weight bearing.

## 2015-07-10 ENCOUNTER — Encounter: Payer: Self-pay | Admitting: Family Medicine

## 2015-08-05 ENCOUNTER — Telehealth: Payer: Self-pay | Admitting: Family Medicine

## 2015-10-17 NOTE — Telephone Encounter (Signed)
Finished

## 2017-06-03 ENCOUNTER — Ambulatory Visit (INDEPENDENT_AMBULATORY_CARE_PROVIDER_SITE_OTHER): Payer: Self-pay

## 2017-06-03 ENCOUNTER — Other Ambulatory Visit: Payer: Self-pay | Admitting: Gerontology

## 2017-06-03 DIAGNOSIS — R52 Pain, unspecified: Secondary | ICD-10-CM

## 2017-06-03 DIAGNOSIS — M7989 Other specified soft tissue disorders: Secondary | ICD-10-CM

## 2021-06-05 ENCOUNTER — Emergency Department: Admit: 2021-06-05 | Discharge status: Absent without leave | Payer: Self-pay | Source: Home / Self Care

## 2021-06-06 ENCOUNTER — Other Ambulatory Visit: Payer: Self-pay | Admitting: Nurse Practitioner

## 2021-06-06 ENCOUNTER — Ambulatory Visit (INDEPENDENT_AMBULATORY_CARE_PROVIDER_SITE_OTHER): Payer: Self-pay

## 2021-06-06 DIAGNOSIS — M25562 Pain in left knee: Secondary | ICD-10-CM

## 2021-06-06 DIAGNOSIS — M25572 Pain in left ankle and joints of left foot: Secondary | ICD-10-CM

## 2021-06-06 DIAGNOSIS — R52 Pain, unspecified: Secondary | ICD-10-CM

## 2021-06-06 DIAGNOSIS — M25561 Pain in right knee: Secondary | ICD-10-CM

## 2021-06-06 DIAGNOSIS — M542 Cervicalgia: Secondary | ICD-10-CM

## 2022-08-31 IMAGING — DX DG KNEE COMPLETE 4+V*L*
4 series · 4 of 4 positions shown · non-contrast
Comparison: None.

CLINICAL DATA: Post fall at work, now with left knee pain.

EXAM:
LEFT KNEE - COMPLETE 4+ VIEW

[knee ap]
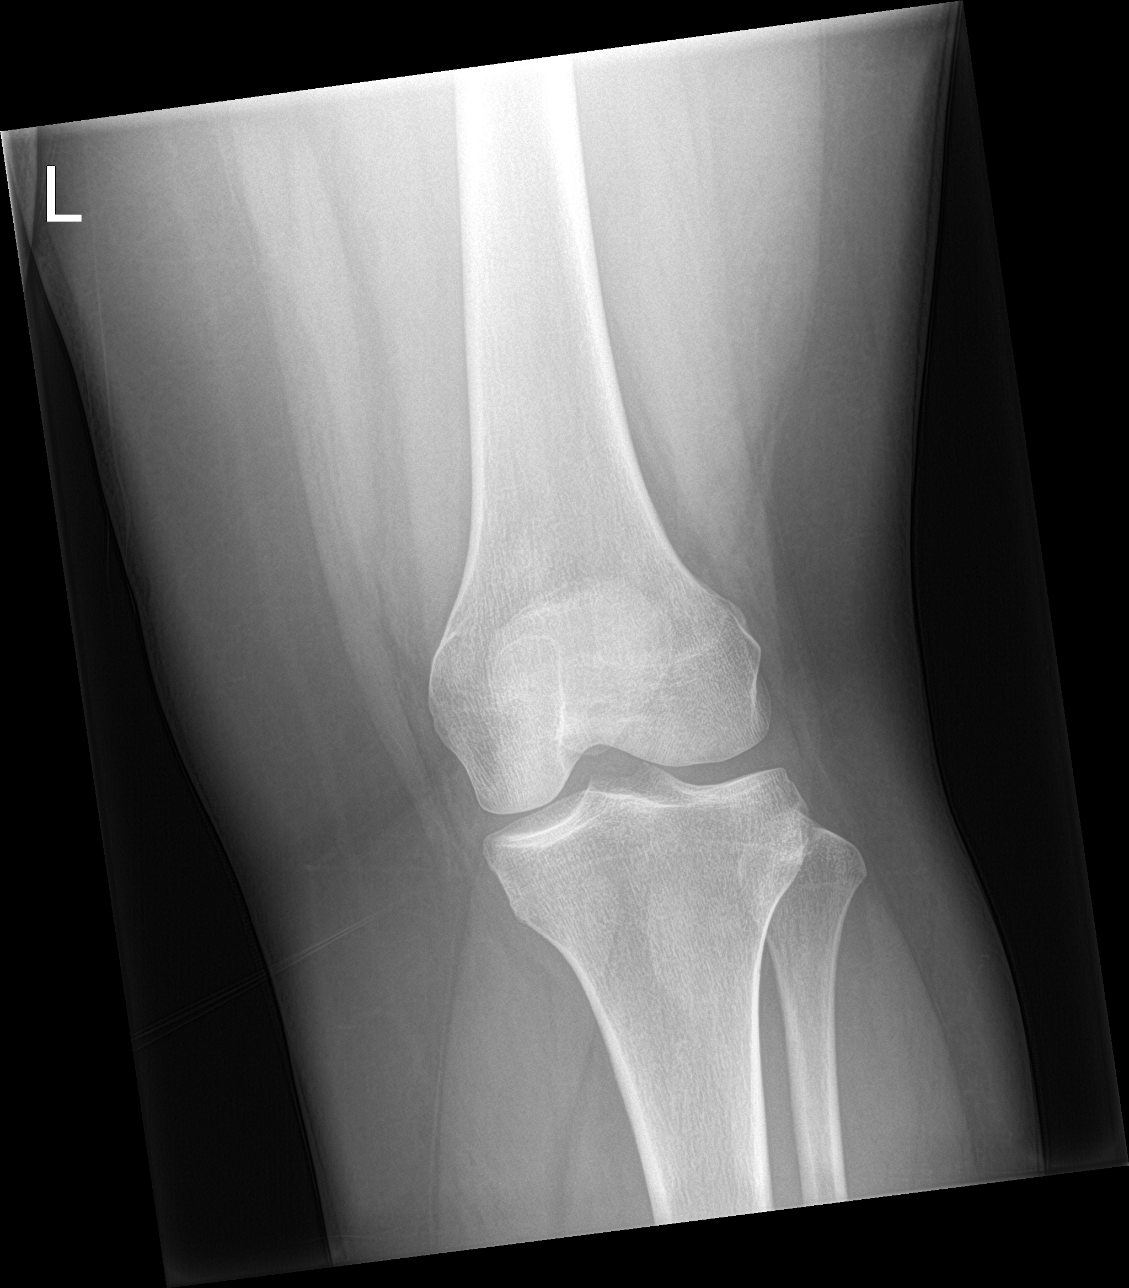

[knee lat]
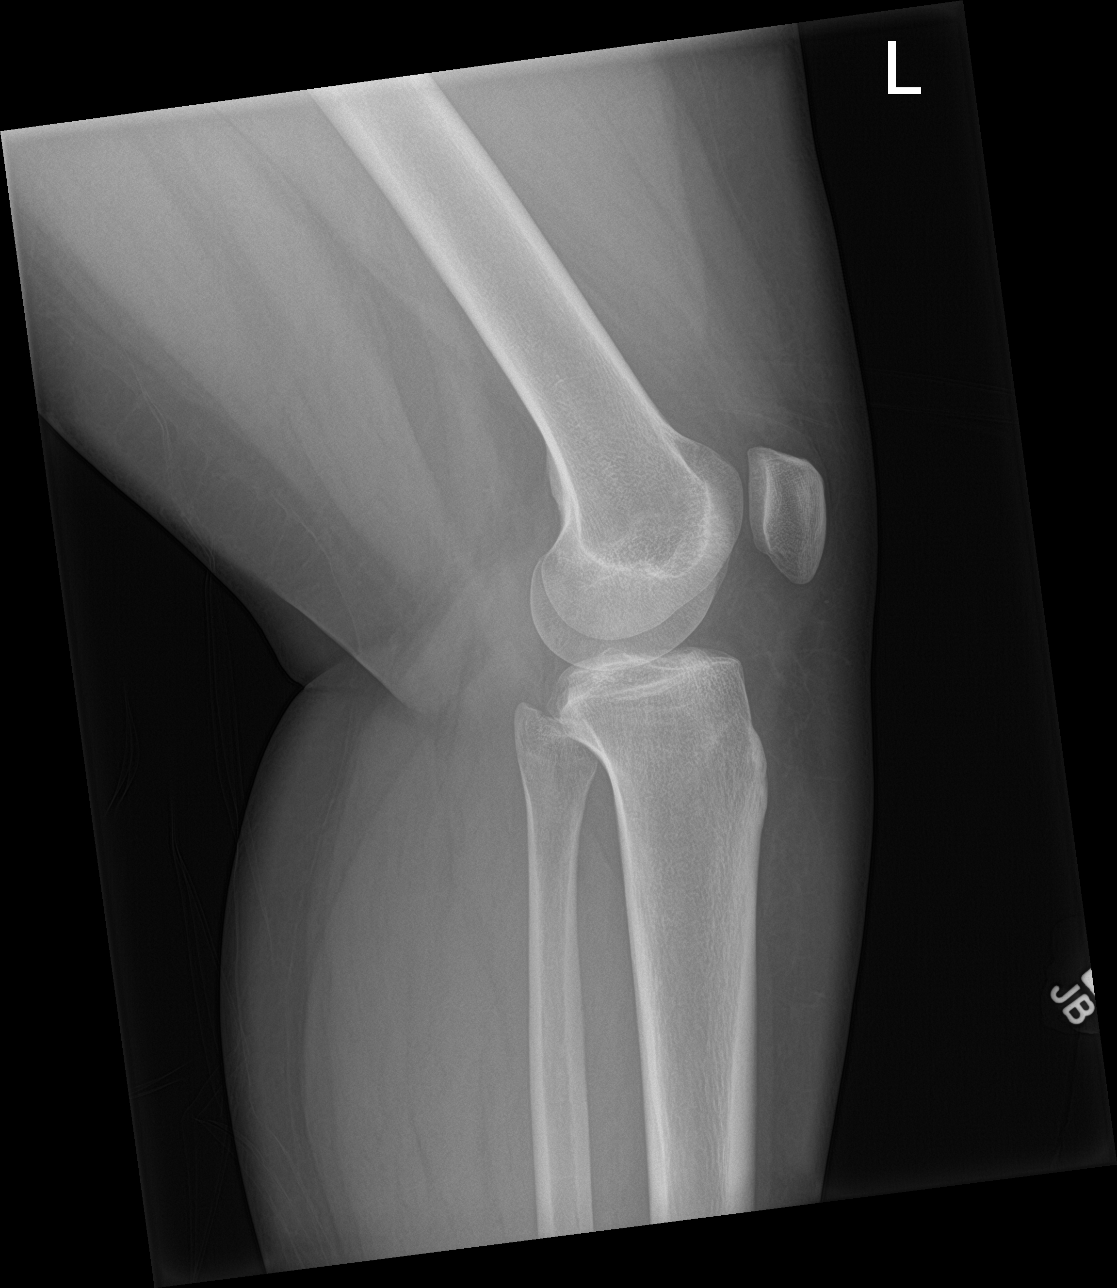

[knee obl (1 of 2)]
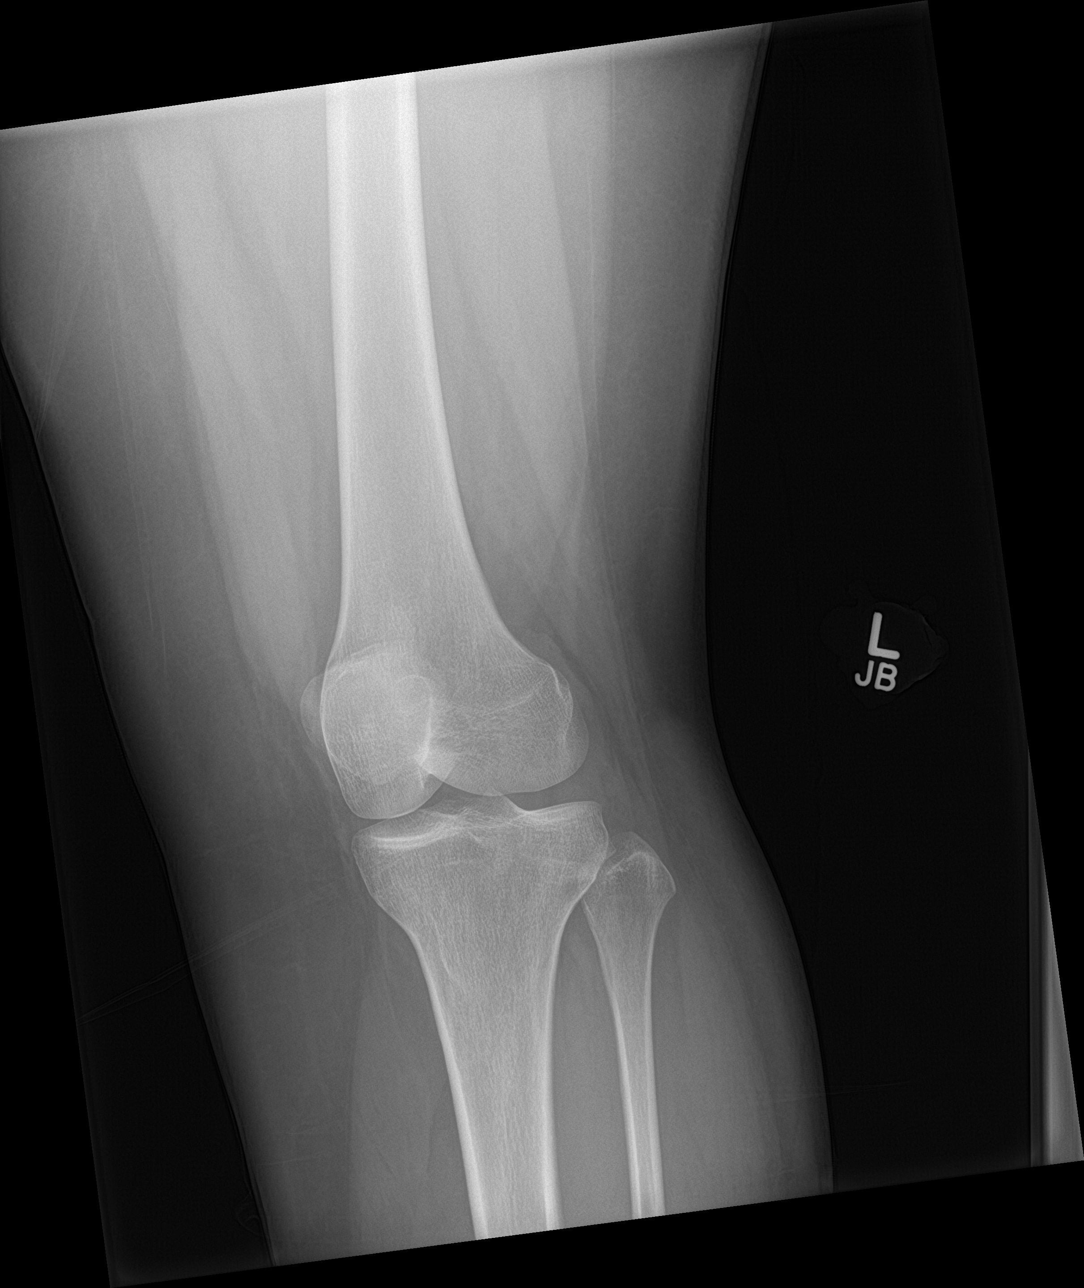

[knee obl (2 of 2)]
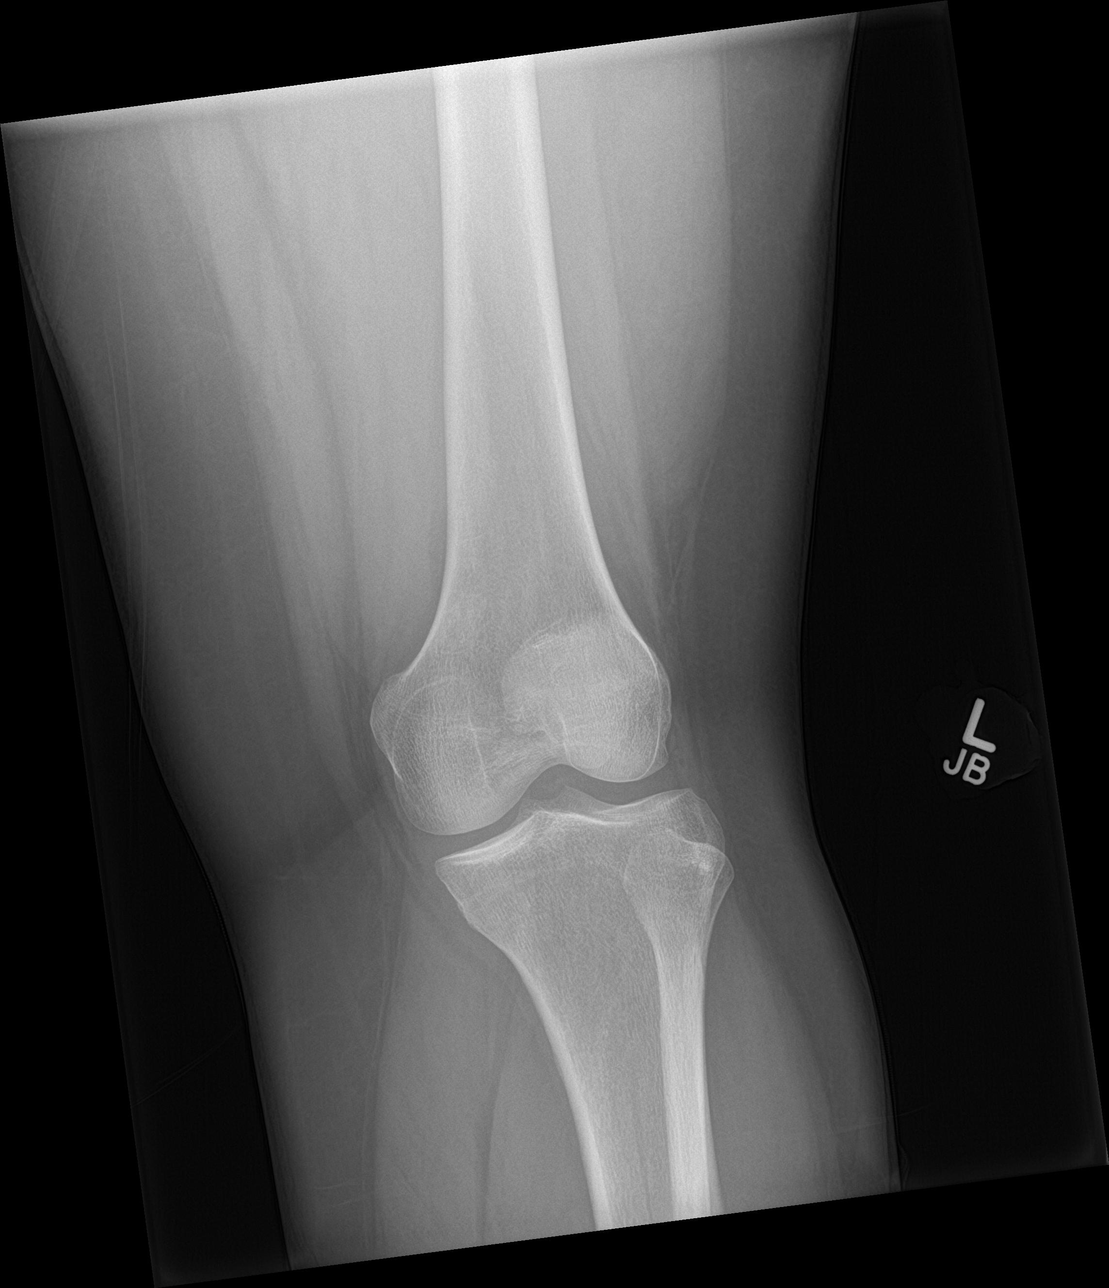

[4 of 4 positions shown; findings below may reference images not displayed]

FINDINGS: No fracture or dislocation. Joint spaces are preserved. No definite
knee joint effusion. Regional soft tissues appear normal.
IMPRESSION: Normal radiographs of the left knee.
